# Patient Record
Sex: Male | Born: 1995 | Race: Black or African American | Hispanic: No | Marital: Single | Smoking: Never smoker
Health system: Southern US, Community
[De-identification: ages and names within clinical notes are randomized; demographics above are authoritative.]

## PROBLEM LIST (undated history)

## (undated) DIAGNOSIS — I10 Essential (primary) hypertension: Secondary | ICD-10-CM

---
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
## 2017-07-06 ENCOUNTER — Other Ambulatory Visit: Payer: Self-pay

## 2017-07-06 ENCOUNTER — Emergency Department (HOSPITAL_BASED_OUTPATIENT_CLINIC_OR_DEPARTMENT_OTHER)
Admission: EM | Admit: 2017-07-06 | Discharge: 2017-07-07 | Disposition: A | Discharge status: Home / Self Care | Payer: Medicaid Other | Attending: Emergency Medicine | Admitting: Emergency Medicine

## 2017-07-06 ENCOUNTER — Encounter (HOSPITAL_BASED_OUTPATIENT_CLINIC_OR_DEPARTMENT_OTHER): Payer: Self-pay | Admitting: Emergency Medicine

## 2017-07-06 DIAGNOSIS — I10 Essential (primary) hypertension: Secondary | ICD-10-CM | POA: Insufficient documentation

## 2017-07-06 DIAGNOSIS — N451 Epididymitis: Secondary | ICD-10-CM | POA: Insufficient documentation

## 2017-07-06 DIAGNOSIS — Z79899 Other long term (current) drug therapy: Secondary | ICD-10-CM | POA: Insufficient documentation

## 2017-07-06 DIAGNOSIS — R369 Urethral discharge, unspecified: Secondary | ICD-10-CM | POA: Diagnosis present

## 2017-07-06 DIAGNOSIS — N5082 Scrotal pain: Secondary | ICD-10-CM

## 2017-07-06 LAB — URINALYSIS, MICROSCOPIC (REFLEX)

## 2017-07-06 LAB — URINALYSIS, ROUTINE W REFLEX MICROSCOPIC
BILIRUBIN URINE: NEGATIVE
GLUCOSE, UA: NEGATIVE mg/dL
KETONES UR: NEGATIVE mg/dL
Nitrite: NEGATIVE
PH: 6.5 (ref 5.0–8.0)
Protein, ur: NEGATIVE mg/dL
SPECIFIC GRAVITY, URINE: 1.025 (ref 1.005–1.030)

## 2017-07-06 NOTE — ED Notes (Signed)
Pt states having groin pain  Diff w urination and penile dc onset yesterday

## 2017-07-06 NOTE — ED Triage Notes (Signed)
Patient states that since last night he has had pelvic pain and penile d/c

## 2017-07-07 ENCOUNTER — Emergency Department (HOSPITAL_BASED_OUTPATIENT_CLINIC_OR_DEPARTMENT_OTHER): Payer: Medicaid Other

## 2017-07-07 MED ORDER — HYDROCODONE-ACETAMINOPHEN 5-325 MG PO TABS: 1.0000 | ORAL_TABLET | Freq: Four times a day (QID) | ORAL | 0 refills | Status: AC | PRN

## 2017-07-07 MED ORDER — CIPROFLOXACIN HCL 500 MG PO TABS: 500.0000 mg | ORAL_TABLET | Freq: Two times a day (BID) | ORAL | 0 refills | Status: AC

## 2017-07-07 NOTE — ED Provider Notes (Signed)
[facility] EMERGENCY DEPARTMENT Provider Note   CSN: [id number] Arrival date & time: 07/06/17  1954     History   Chief Complaint Chief Complaint  Patient presents with  . Penile Discharge    HPI Jeremy Nunez is a 22 y.o. male.  Patient is a 22 year old male presenting with complaints of left testicle pain, swelling, and burning with urination.  This began this morning in the absence of any injury or trauma.  He denies any fevers or chills.  He does state that he noticed a small amount of blood when he urinated.  He denies any new sexual contacts and denies any sexual activity in nearly 1 year.   The history is provided by the patient.  Testicle Pain  This is a new problem. Episode onset: This morning. The problem occurs constantly. The problem has been rapidly worsening. Pertinent negatives include no abdominal pain. Exacerbated by: Movement and palpation. Nothing relieves the symptoms. He has tried nothing for the symptoms.    Past Medical History:  Diagnosis Date  . Hypertension     There are no active problems to display for this patient.   History reviewed. No pertinent surgical history.     Home Medications    Prior to Admission medications   Medication Sig Start Date End Date Taking? Authorizing Provider  hydrOXYzine (ATARAX/VISTARIL) 25 MG tablet Take 1 tablet (25 mg total) by mouth every 6 (six) hours. 02/15/13   Elson AreasSofia, Leslie K, PA-C  lisinopril (PRINIVIL,ZESTRIL) 5 MG tablet Take 1 tablet (5 mg total) by mouth daily. 12/20/12   Loren RacerYelverton, David, MD  predniSONE (DELTASONE) 10 MG tablet 6,5,4,3,2,1 taper 02/15/13   Cheron SchaumannSofia, Leslie K, PA-C  triamcinolone ointment (KENALOG) 0.1 % Apply topically 2 (two) times daily. 02/15/13   Elson AreasSofia, Leslie K, PA-C    Family History History reviewed. No pertinent family history.  Social History Social History   Tobacco Use  . Smoking status: Never Smoker  . Smokeless tobacco: Never Used  Substance Use Topics    . Alcohol use: No  . Drug use: No     Allergies   Patient has no known allergies.   Review of Systems Review of Systems  Gastrointestinal: Negative for abdominal pain.  Genitourinary: Positive for testicular pain.  All other systems reviewed and are negative.    Physical Exam Updated Vital Signs BP (!) 147/93 (BP Location: Left Arm)   Pulse 74   Temp 99.1 F (37.3 C) (Oral)   Resp 18   Ht 5\' 11"  (1.803 m)   SpO2 100%   Physical Exam  Constitutional: He is oriented to person, place, and time. He appears well-developed and well-nourished. No distress.  HENT:  Head: Normocephalic and atraumatic.  Neck: Normal range of motion. Neck supple.  Pulmonary/Chest: Effort normal.  Abdominal: Soft. He exhibits no distension. There is no tenderness.  Genitourinary: Penis normal.  Genitourinary Comments: The left testicle is swollen.  It is tender with palpation.  Exam is somewhat limited secondary to pain, however does appear to move freely within the scrotal sac.  Neurological: He is alert and oriented to person, place, and time.  Skin: He is not diaphoretic.  Nursing note and vitals reviewed.    ED Treatments / Results  Labs (all labs ordered are listed, but only abnormal results are displayed) Labs Reviewed  URINALYSIS, ROUTINE W REFLEX MICROSCOPIC - Abnormal; Notable for the following components:      Result Value   APPearance CLOUDY (*)  Hgb urine dipstick TRACE (*)    Leukocytes, UA SMALL (*)    All other components within normal limits  URINALYSIS, MICROSCOPIC (REFLEX) - Abnormal; Notable for the following components:   Bacteria, UA FEW (*)    Squamous Epithelial / LPF 0-5 (*)    All other components within normal limits  GC/CHLAMYDIA PROBE AMP ([place]) NOT AT [facility]    EKG  EKG Interpretation None       Radiology No results found.  Procedures Procedures (including critical care time)  Medications Ordered in ED Medications - No data to  display   Initial Impression / Assessment and Plan / ED Course  I have reviewed the triage vital signs and the nursing notes.  Pertinent labs & imaging results that were available during my care of the patient were reviewed by me and considered in my medical decision making (see chart for details).  Exam and ultrasound consistent with epididymitis.  GC and Chlamydia cultures are pending although the patient reports no sexual activity in nearly 1 year.  He will be treated with Cipro and as needed follow-up.  Final Clinical Impressions(s) / ED Diagnoses   Final diagnoses:  None    ED Discharge Orders    None       Geoffery Lyons, MD 07/07/17 [phone]

## 2017-07-07 NOTE — Discharge Instructions (Signed)
Cipro as prescribed.  Hydrocodone is prescribed as needed for pain.  We will call you if your cultures indicate you require further treatment or action.  Follow-up with your primary doctor if not improving in the next 3-4 days.

## 2017-07-08 LAB — GC/CHLAMYDIA PROBE AMP (~~LOC~~) NOT AT ARMC
Chlamydia: POSITIVE — AB
Neisseria Gonorrhea: NEGATIVE

## 2017-07-13 ENCOUNTER — Telehealth: Payer: Self-pay | Admitting: Medical

## 2017-07-13 DIAGNOSIS — A749 Chlamydial infection, unspecified: Secondary | ICD-10-CM

## 2017-07-13 MED ORDER — AZITHROMYCIN 250 MG PO TABS: 1000.0000 mg | ORAL_TABLET | Freq: Once | ORAL | 0 refills | Status: AC

## 2017-07-13 NOTE — Telephone Encounter (Addendum)
Wynelle BourgeoisJailyn C Roemmich tested positive for  Chlamydia. Patient was called by RN and allergies and pharmacy confirmed. Rx sent to pharmacy of choice.   Marny LowensteinWenzel, Jonnelle Lawniczak N, PA-C 07/13/2017 12:34 PM       ----- Message from Kathe BectonLori S Berdik, RN sent at 07/13/2017 12:12 PM EST ----- This patient tested positive for :  Chlamydia He :"has NKDA", I have informed the patient of his results and confirmed his pharmacy is correct in his chart. Please send Rx.   Thank you,   Kathe BectonBerdik, Lori S, RN   [facility]

## 2019-09-28 IMAGING — US US SCROTUM W/ DOPPLER COMPLETE
1 series · 13 of 25 positions shown · non-contrast
Comparison: None.

CLINICAL DATA: Left scrotal pain for 2 days. Left scrotal swelling.

EXAM:
SCROTAL ULTRASOUND
DOPPLER ULTRASOUND OF THE TESTICLES
TECHNIQUE: Complete ultrasound examination of the testicles, epididymis, and
other scrotal structures was performed. Color and spectral Doppler
ultrasound were also utilized to evaluate blood flow to the
testicles.

[Series 1: us scrotum w/ doppler complete · 0.07mm/px · 13 of 28 slices shown]
[im 1/28]
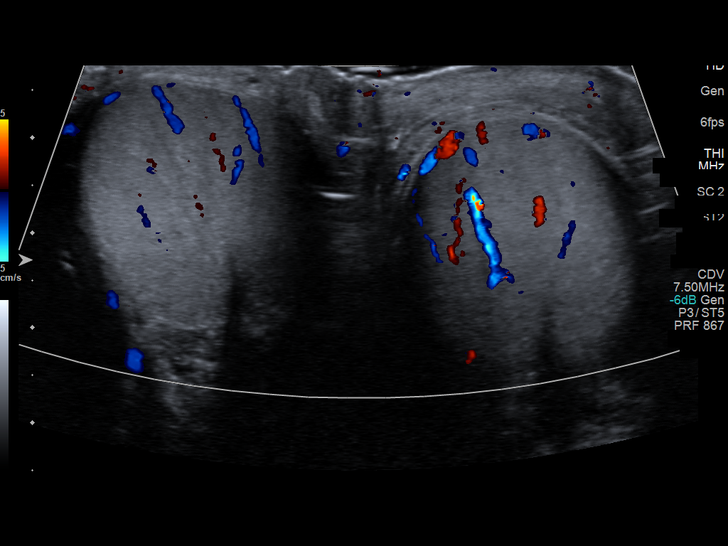
[im 3/28]
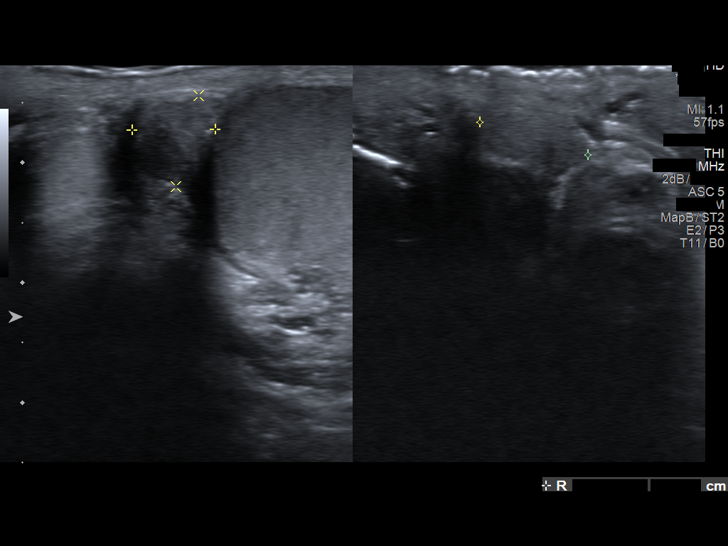
[im 5/28]
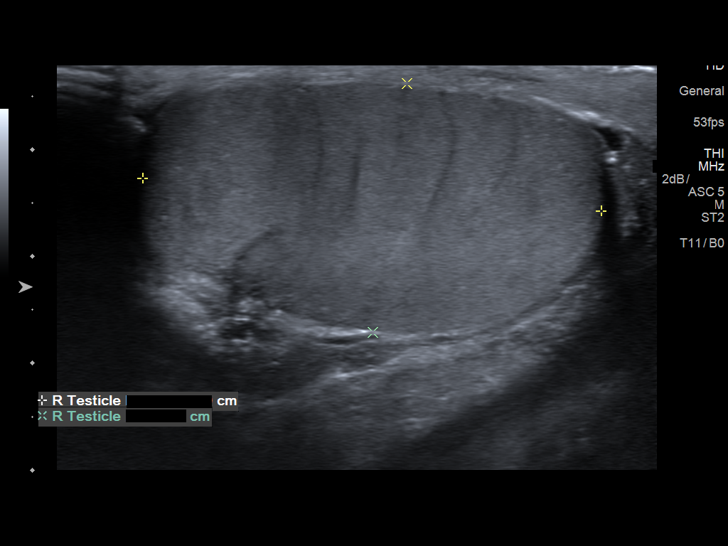
[im 7/28]
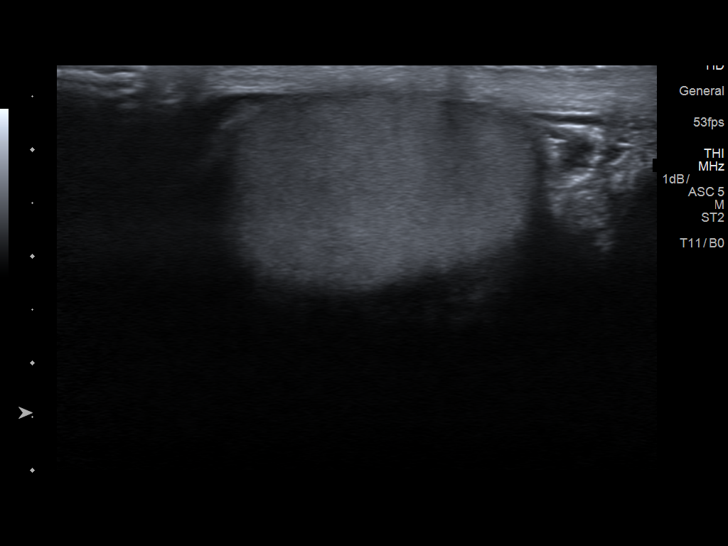
[im 10/28]
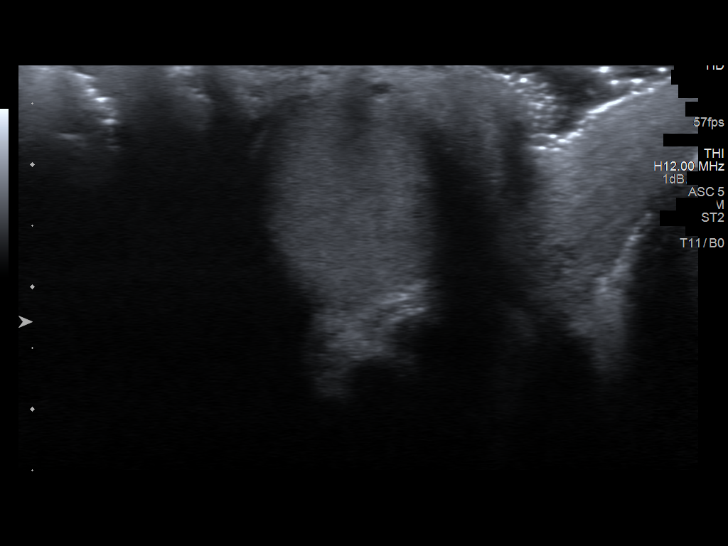
[im 12/28]
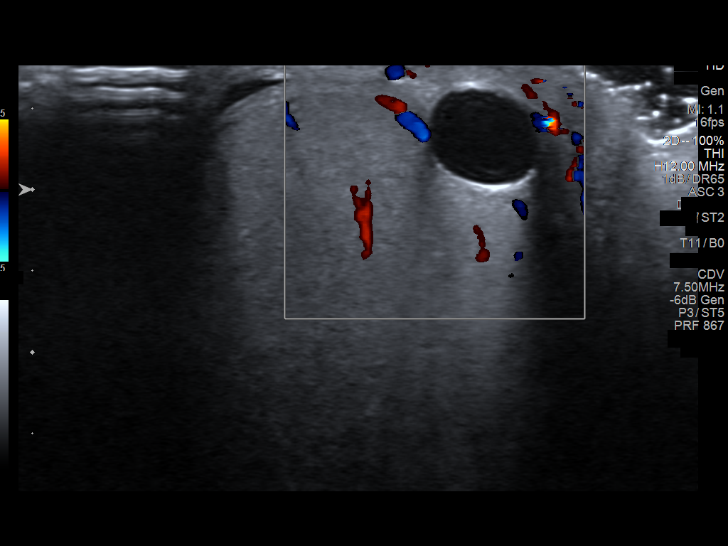
[im 14/28]
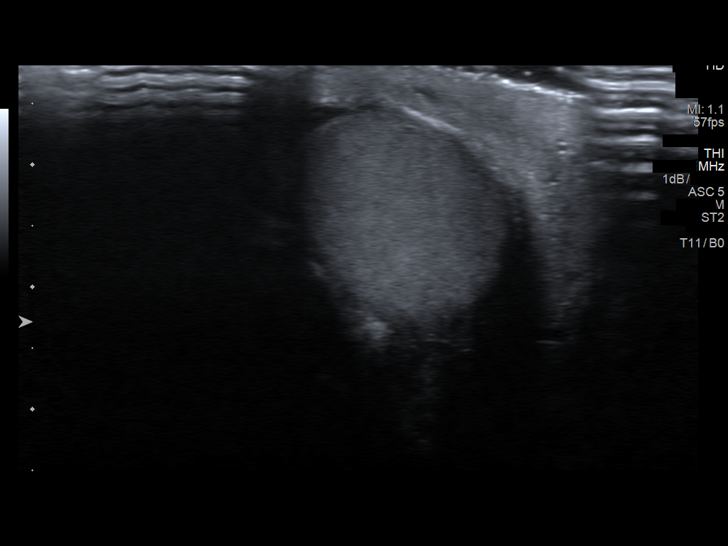
[im 16/28]
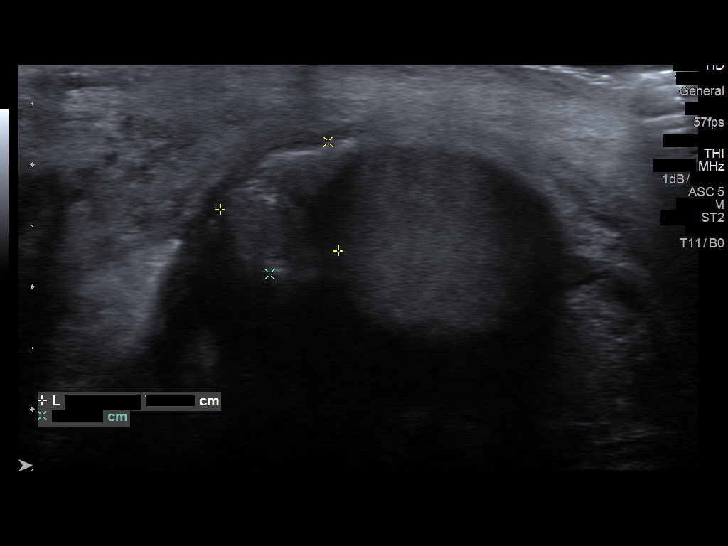
[im 19/28]
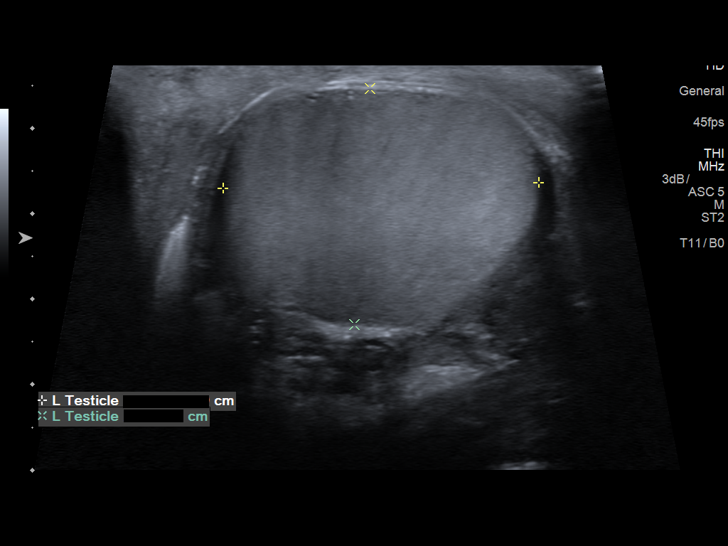
[im 21/28]
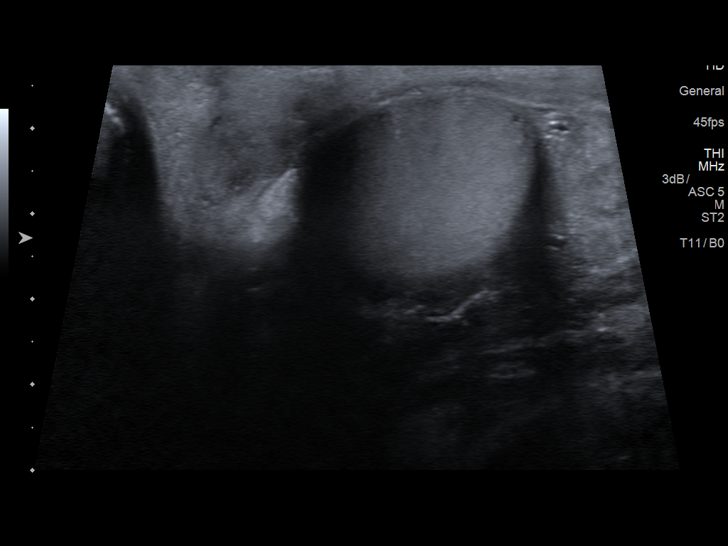
[im 23/28]
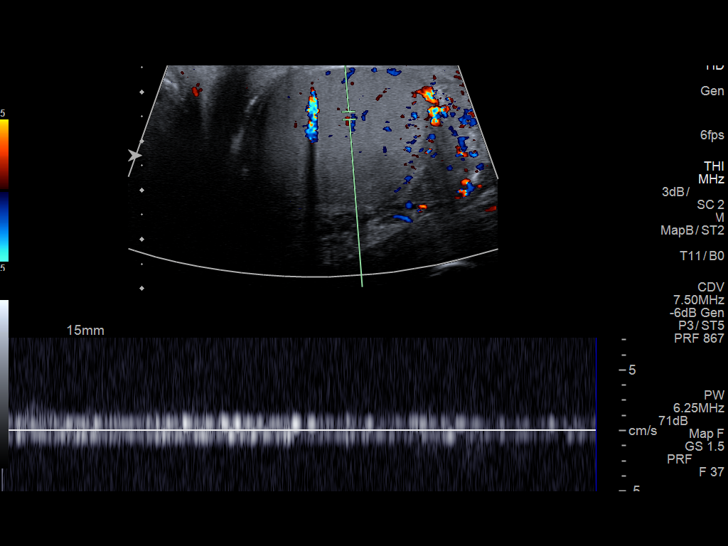
[im 25/28]
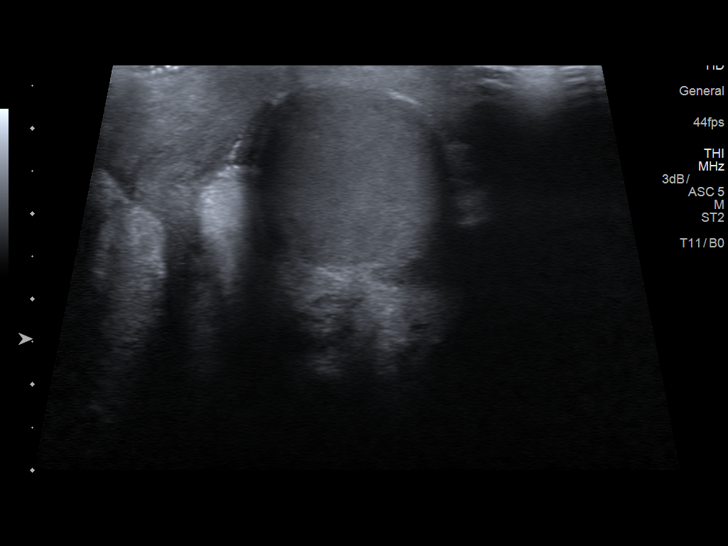
[im 28/28]
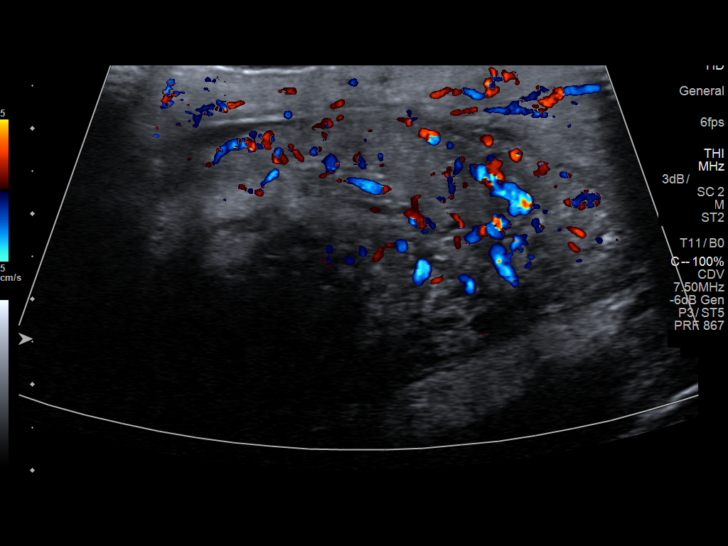

[13 of 25 positions shown; findings below may reference images not displayed]

FINDINGS: Right testicle

Measurements: 4.3 x 2.4 x 2.5 cm. No mass or microlithiasis
visualized. There is a benign-appearing cyst in the periphery of the
right testicle measuring 0.9 x 0.6 x 0.7 cm.

Left testicle

Measurements: 3.7 x 2.8 x 3.1 cm. No mass or microlithiasis
visualized.

Right epididymis:  Normal in size and appearance.

Left epididymis:  Normal in size.  Hypervascular appearance.

Hydrocele:  None visualized.

Varicocele:  None visualized.

Pulsed Doppler interrogation of both testes demonstrates normal low
resistance arterial and venous waveforms bilaterally.

Ill-defined echogenic areas seen within the superior aspect of the
left scrotum. Left scrotal wall edema noted.
IMPRESSION: Normal appearance of the testicles.

Hypervascular appearance of the left epididymis, consistent with
epididymitis.

Ill-defined hyperechoic area within the superior aspect of the left
scrotum with uncertain etiology. This may represent a fat containing
inguinal hernia.

Left scrotal wall edema likely infectious/inflammatory.

## 2024-06-02 ENCOUNTER — Emergency Department (HOSPITAL_COMMUNITY): Payer: Self-pay

## 2024-06-02 ENCOUNTER — Other Ambulatory Visit: Payer: Self-pay

## 2024-06-02 ENCOUNTER — Emergency Department (HOSPITAL_COMMUNITY)
Admission: EM | Admit: 2024-06-02 | Discharge: 2024-06-02 | Disposition: A | Discharge status: Home / Self Care | Payer: Self-pay | Attending: Emergency Medicine | Admitting: Emergency Medicine

## 2024-06-02 DIAGNOSIS — S40012A Contusion of left shoulder, initial encounter: Secondary | ICD-10-CM | POA: Insufficient documentation

## 2024-06-02 DIAGNOSIS — M25512 Pain in left shoulder: Secondary | ICD-10-CM | POA: Insufficient documentation

## 2024-06-02 DIAGNOSIS — Y92410 Unspecified street and highway as the place of occurrence of the external cause: Secondary | ICD-10-CM | POA: Insufficient documentation

## 2024-06-02 DIAGNOSIS — I10 Essential (primary) hypertension: Secondary | ICD-10-CM | POA: Insufficient documentation

## 2024-06-02 MED ORDER — METHOCARBAMOL 500 MG PO TABS: 500.0000 mg | ORAL_TABLET | Freq: Two times a day (BID) | ORAL | 0 refills | Status: AC | PRN

## 2024-06-02 MED ORDER — NAPROXEN 500 MG PO TABS: 500.0000 mg | ORAL_TABLET | Freq: Two times a day (BID) | ORAL | 0 refills | Status: AC

## 2024-06-02 MED ORDER — KETOROLAC TROMETHAMINE 15 MG/ML IJ SOLN
15.0000 mg | Freq: Once | INTRAMUSCULAR | Status: AC
  Administered 2024-06-02: 15 mg via INTRAVENOUS
  Filled 2024-06-02: qty 1
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
## 2024-06-02 NOTE — ED Notes (Signed)
 X-ray at bedside.
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
## 2024-06-02 NOTE — Discharge Instructions (Addendum)
Alternate ice and heat to areas of injury 3-4 times per day to limit inflammation and spasm.  Avoid strenuous activity and heavy lifting.  We recommend consistent use of naproxen in addition to Robaxin for muscle spasms. Do not drive or drink alcohol after taking Robaxin as it may make you drowsy and impair your judgment.  We recommend follow-up with a primary care doctor to ensure resolution of symptoms.  Return to the ED for any new or concerning symptoms. 

## 2024-07-19 ENCOUNTER — Other Ambulatory Visit: Payer: Self-pay | Admitting: Surgery

## 2024-07-19 DIAGNOSIS — M25512 Pain in left shoulder: Secondary | ICD-10-CM

## 2024-07-19 DIAGNOSIS — S39012A Strain of muscle, fascia and tendon of lower back, initial encounter: Secondary | ICD-10-CM

## 2024-07-19 DIAGNOSIS — S161XXA Strain of muscle, fascia and tendon at neck level, initial encounter: Secondary | ICD-10-CM

## 2024-07-20 ENCOUNTER — Other Ambulatory Visit: Payer: Self-pay | Admitting: Surgery

## 2024-07-20 DIAGNOSIS — S161XXA Strain of muscle, fascia and tendon at neck level, initial encounter: Secondary | ICD-10-CM

## 2024-07-20 DIAGNOSIS — M25512 Pain in left shoulder: Secondary | ICD-10-CM

## 2024-07-20 DIAGNOSIS — S39012A Strain of muscle, fascia and tendon of lower back, initial encounter: Secondary | ICD-10-CM
